# Patient Record
Sex: Male | Born: 2000
Health system: Southern US, Community
[De-identification: ages and names within clinical notes are randomized; demographics above are authoritative.]

## PROBLEM LIST (undated history)

## (undated) DIAGNOSIS — S060X9A Concussion with loss of consciousness of unspecified duration, initial encounter: Secondary | ICD-10-CM

## (undated) DIAGNOSIS — L700 Acne vulgaris: Secondary | ICD-10-CM

## (undated) DIAGNOSIS — S8290XA Unspecified fracture of unspecified lower leg, initial encounter for closed fracture: Secondary | ICD-10-CM

## (undated) DIAGNOSIS — S060XAA Concussion with loss of consciousness status unknown, initial encounter: Secondary | ICD-10-CM

## (undated) DIAGNOSIS — T7840XA Allergy, unspecified, initial encounter: Secondary | ICD-10-CM

## (undated) DIAGNOSIS — K900 Celiac disease: Secondary | ICD-10-CM

## (undated) HISTORY — DX: Acne vulgaris: L70.0

## (undated) HISTORY — PX: OTHER SURGICAL HISTORY: SHX169

## (undated) HISTORY — DX: Celiac disease: K90.0

## (undated) HISTORY — DX: Unspecified fracture of unspecified lower leg, initial encounter for closed fracture: S82.90XA

## (undated) HISTORY — DX: Allergy, unspecified, initial encounter: T78.40XA

---
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
## 2018-02-16 ENCOUNTER — Telehealth: Payer: Self-pay | Admitting: Family Medicine

## 2018-02-16 NOTE — Telephone Encounter (Signed)
Copied from [place] [id number]. Topic: Referral - Request >> Feb 16, 2018  3:43 PM Mylinda Latina, NT wrote: Reason for CRM: Patient mother called and states the patient would like to be tested for ADHD. She is wanting a referral to a specialist that does this type of testing . Please call when and if the referral is placed   CB# [phone]

## 2018-02-19 NOTE — Telephone Encounter (Signed)
Dr. Irven Shelling does that testing here at [facility]

## 2018-02-19 NOTE — Telephone Encounter (Signed)
Sent to PCP to advise will an OV with you be needed first?

## 2018-02-19 NOTE — Telephone Encounter (Signed)
Called and spoke with pt's mother mother advise to call [phone] to get set up for an appt with Irven Shelling PhD.

## 2018-02-27 ENCOUNTER — Ambulatory Visit (INDEPENDENT_AMBULATORY_CARE_PROVIDER_SITE_OTHER): Payer: BLUE CROSS/BLUE SHIELD | Admitting: Psychology

## 2018-02-27 DIAGNOSIS — F902 Attention-deficit hyperactivity disorder, combined type: Secondary | ICD-10-CM | POA: Diagnosis not present

## 2018-03-08 ENCOUNTER — Ambulatory Visit (INDEPENDENT_AMBULATORY_CARE_PROVIDER_SITE_OTHER): Payer: BLUE CROSS/BLUE SHIELD | Admitting: Psychology

## 2018-03-08 DIAGNOSIS — F902 Attention-deficit hyperactivity disorder, combined type: Secondary | ICD-10-CM | POA: Diagnosis not present

## 2018-03-08 DIAGNOSIS — F1010 Alcohol abuse, uncomplicated: Secondary | ICD-10-CM

## 2018-03-12 ENCOUNTER — Telehealth: Payer: Self-pay | Admitting: *Deleted

## 2018-03-12 ENCOUNTER — Encounter: Payer: Self-pay | Admitting: Family Medicine

## 2018-03-12 ENCOUNTER — Ambulatory Visit: Payer: BLUE CROSS/BLUE SHIELD | Admitting: Family Medicine

## 2018-03-12 VITALS — BP 112/60 | HR 78 | Temp 98.1°F | Ht 72.0 in | Wt 190.8 lb

## 2018-03-12 DIAGNOSIS — F900 Attention-deficit hyperactivity disorder, predominantly inattentive type: Secondary | ICD-10-CM

## 2018-03-12 MED ORDER — LISDEXAMFETAMINE DIMESYLATE 40 MG PO CAPS: 40.0000 mg | ORAL_CAPSULE | ORAL | 0 refills | Status: DC

## 2018-03-12 NOTE — Telephone Encounter (Signed)
>>   Mar 12, 2018  3:52 PM Sheran Luz wrote: Patients mother called back after speaking with the pharmacy. The pharmacy advised her that the pre authorization needs to be sent to their insurance company BC/BS explaining why pt needs to be on this medication. Pt is only in town for today. Pts mother requested that this medication now be sent to.    [place], [place], [place] [address] [phone]

## 2018-03-12 NOTE — Telephone Encounter (Signed)
Copied from [place] [phone]. Topic: General - Other >> Mar 12, 2018  3:23 PM Judyann Munson wrote: Reason for CRM:  Patient mother is calling to advise the medication lisdexamfetamine (VYVANSE) 40 MG capsule is needing a pre authorization for the pharmacy . The patient will be leaving for back to school this evening and is needing this medication today please advise

## 2018-03-12 NOTE — Progress Notes (Signed)
   Subjective:    Patient ID: Angel Foster, male    DOB: Dec 12, 2000, 17 y.o.   MRN: [id number]  HPI Here to discuss treatment for ADHD. He did well in high school but he has struggled in college to focus and get his work done. He is entering his sophomore year at [employer] this week. He had testing done with Dr. Irven Shelling and she diagnosed him with ADHD. Of note I treat his father for ADHD as well. Angel Foster admits to trying some medications last year from his friends to help him study. He tried Adderall and it helped, but it made him feel anxious and jittery. He then tried Vyvanse and he was very happy with how this worked for him.    Review of Systems  Constitutional: Negative.   Respiratory: Negative.   Cardiovascular: Negative.   Neurological: Negative.   Psychiatric/Behavioral: Positive for decreased concentration. Negative for dysphoric mood. The patient is not nervous/anxious.        Objective:   Physical Exam  Constitutional: He is oriented to person, place, and time. He appears well-developed and well-nourished.  Cardiovascular: Normal rate, regular rhythm, normal heart sounds and intact distal pulses.  Pulmonary/Chest: Effort normal and breath sounds normal.  Neurological: He is alert and oriented to person, place, and time.          Assessment & Plan:  ADHD, try Vyvanse 40 mg every morning. He will report back to [place] in a few weeks.  Alysia Penna, MD

## 2018-03-12 NOTE — Telephone Encounter (Signed)
Prior auth for Vyvanse 37m sent to [email or web address]-key [id number].  Approval given-CaseId:[id number];Status:Approved;Review Type:Prior Auth;Coverage Start Date:02/10/2018;Coverage End Date:03/12/2019 and [facility], informed Angel Foster this and he stated he will inform the pt.

## 2018-03-21 ENCOUNTER — Ambulatory Visit: Payer: BLUE CROSS/BLUE SHIELD | Admitting: Psychology

## 2018-04-03 ENCOUNTER — Telehealth: Payer: Self-pay | Admitting: Family Medicine

## 2018-04-03 NOTE — Telephone Encounter (Signed)
Last OV 03/12/2018   Last refilled 03/12/2018 disp 30 with no refills   Sent to PCP to advise   Pharmacy has been changed to refill vyvanse

## 2018-04-03 NOTE — Telephone Encounter (Signed)
Copied from [place] [phone]. Topic: Quick Communication - See Telephone Encounter >> Apr 03, 2018  8:42 AM Ahmed Prima L wrote: CRM for notification. See Telephone encounter for: 04/03/18.  Patient's mom called and would like lisdexamfetamine (VYVANSE) 40 MG capsule to be transferred to another pharmacy. [facility] [address] [address], [place]. The phone number is [phone].  She would like to also know can next time it be [facility] day supply [facility] so that she does not have to call each month? [facility], [facility] will fill it for 90 days for lisdexamfetamine (VYVANSE) 40 MG capsule

## 2018-04-04 MED ORDER — LISDEXAMFETAMINE DIMESYLATE 40 MG PO CAPS: 40.0000 mg | ORAL_CAPSULE | ORAL | 0 refills | Status: DC

## 2018-04-04 NOTE — Telephone Encounter (Signed)
[facility] [place] and I sent in #90 to [facility] to begin on 05-04-18

## 2018-04-04 NOTE — Telephone Encounter (Signed)
Called and spoke with pt's mother. Mother advised and voiced understanding.

## 2018-05-07 NOTE — Telephone Encounter (Signed)
Pts mom states that [facility] canceled the refill request for lisdexamfetamine (VYVANSE) 40 MG capsule  And needing a prior auth before they can fill for this pt. Also, they stated a rx cannot be written for a future date. Mom states pt took his last pill today. She would like to prior auth done for the medication, and to see if a 30 day refill can be sent ot he [facility] in [place] to last until [facility] can fill the medication? Please advise mom.  [id number] [place] [place] [phone] (Phone) [phone] (Fax)

## 2018-05-08 MED ORDER — LISDEXAMFETAMINE DIMESYLATE 40 MG PO CAPS: 40.0000 mg | ORAL_CAPSULE | ORAL | 0 refills | Status: DC

## 2018-05-08 NOTE — Telephone Encounter (Addendum)
Mom calling back today to check on the status of the 30 day lisdexamfetamine (VYVANSE) 40 MG capsule  Mom states pt is completely out of this med, and has 6 hours of class today.  Mom states you can work on the prior, but she needs the rx asap sent to  [facility] [place] [place] [phone] (Phone) [phone] (Fax)   As noted[facility] not [facility], and she is very concerned pt does not have any meds.

## 2018-05-08 NOTE — Telephone Encounter (Signed)
Done

## 2018-05-08 NOTE — Telephone Encounter (Signed)
30 day of the vyvanse sent to the pharmacy per Dr. Sarajane Jews.  Will still need the PA done for the 90 day supply.  JoAnn please advise. Thanks

## 2018-05-08 NOTE — Telephone Encounter (Signed)
Dr. Sarajane Jews please advise on the 30 day suppy of vyvanse that needs to be sen to the local pharmacy today.

## 2018-05-09 NOTE — Telephone Encounter (Signed)
Attempted to resubmit prior auth for 90 tablets.  Note in [email or web address].com stated: " An active PA is already on file with expiration date of 03/12/2019. Please wait to resubmit request within 60 days of that expiration date to obtain a PA renewal".  I called the pts mother Anderson Malta and left a detailed message with this info and to contact the insurance with any further questions.

## 2018-05-25 ENCOUNTER — Ambulatory Visit: Payer: Self-pay | Admitting: Psychology

## 2018-05-31 NOTE — Telephone Encounter (Signed)
Mom, Anderson Malta needs to know if prior Angel Foster was already approved for a 90 days supply? Patient will run out of medication on 06/08/2018 and has been wanting the 90 day through [facility]. The current script on file was written to be filled on 07/08/2018. Please call her back to clarify whether the patient needs to request another 30 day supply or if a 90 day will be called in?

## 2018-06-01 NOTE — Telephone Encounter (Addendum)
Caller name:Limas,Jennifer Relation to pt: mother  Call back number: [phone]   Reason for call:  Mother checking on the status of message below, please advise

## 2018-06-04 NOTE — Telephone Encounter (Signed)
I called the pts mother and informed her a prior Josem Kaufmann was requested and cannot be done as this was requested and approved through August 2020 (see phone note from 10/16 and 8/19) and I advised she contact the insurance company and not [employer].  She asked that a [facility] [place] at this time as he will run out on 11/15.  Message sent to Dr Barbie Banner asst.

## 2018-06-05 MED ORDER — LISDEXAMFETAMINE DIMESYLATE 40 MG PO CAPS: 40.0000 mg | ORAL_CAPSULE | ORAL | 0 refills | Status: DC

## 2018-06-05 NOTE — Addendum Note (Signed)
Addended by: Alysia Penna A on: 06/05/2018 05:07 PM   Modules accepted: Orders

## 2018-06-05 NOTE — Telephone Encounter (Signed)
I sent a 30 day supply to [facility]

## 2018-06-05 NOTE — Telephone Encounter (Signed)
Called and spoke with pts mother and she is aware of the rx that has been sent to the pharmacy in [place].

## 2018-06-05 NOTE — Telephone Encounter (Signed)
Dr. Sarajane Jews please advise of the 30 day supply that they are requesting to get sent in. Thanks

## 2018-06-25 ENCOUNTER — Telehealth: Payer: Self-pay | Admitting: *Deleted

## 2018-06-25 NOTE — Telephone Encounter (Signed)
No he does not need a booster. He had both doses in the past

## 2018-06-25 NOTE — Telephone Encounter (Signed)
Copied from [place] [id number]. Topic: General - Inquiry >> Jun 20, 2018  2:02 PM Scherrie Gerlach wrote: Reason for CRM: mom states pt will pt will be home from college this next week and wants to know if the dr thinks he should get a mmr booster? He is not at any of the colleges that had an outbreak, she just wanted to know if you thought a good idea.   Dr. Sarajane Jews please advise. Thanks

## 2018-07-03 ENCOUNTER — Other Ambulatory Visit: Payer: Self-pay | Admitting: Family Medicine

## 2018-07-03 NOTE — Telephone Encounter (Signed)
Copied from [place] [id number]. Topic: Quick Communication - Rx Refill/Question >> Jul 03, 2018 10:14 AM Reyne Dumas L wrote: Medication: lisdexamfetamine (VYVANSE) 40 MG capsule  Has the patient contacted their pharmacy? No - usually sent to pharmacy at pt's school, but he will be in [place] for break and needs this sent to local pharmacy (Agent: If no, request that the patient contact the pharmacy for the refill.) (Agent: If yes, when and what did the pharmacy advise?)  Preferred Pharmacy (with phone number or street name): [id number] [phone](Phone) [phone](Fax)  Agent: Please be advised that RX refills may take up to 3 business days. We ask that you follow-up with your pharmacy.

## 2018-07-05 NOTE — Telephone Encounter (Signed)
Called and spoke with pts mother and she is aware that there is no need for the booster mmr.

## 2018-07-09 NOTE — Telephone Encounter (Signed)
Dr. Sarajane Jews please advise on refill. Thanks

## 2018-07-09 NOTE — Telephone Encounter (Signed)
Patient calling to check the status of this refill. States that she has checked with the pharmacy and they have not received anything. States that the patient was supposed to have this medication by 07/08/18. Please advise. Would like a call regarding this matter today.  CB#: [phone]

## 2018-07-10 MED ORDER — LISDEXAMFETAMINE DIMESYLATE 40 MG PO CAPS: 40.0000 mg | ORAL_CAPSULE | ORAL | 0 refills | Status: DC

## 2018-07-10 NOTE — Telephone Encounter (Signed)
Done

## 2018-08-06 ENCOUNTER — Telehealth: Payer: Self-pay | Admitting: Family Medicine

## 2018-08-06 NOTE — Telephone Encounter (Signed)
Dr. Sarajane Jews please advise of refills.  Thanks

## 2018-08-06 NOTE — Telephone Encounter (Signed)
Copied from [place] [id number]. Topic: Quick Communication - Rx Refill/Question >> Aug 06, 2018  3:54 PM Judyann Munson wrote: Medication: lisdexamfetamine (VYVANSE) 40 MG capsule  Has the patient contacted their pharmacy? No   Preferred Pharmacy (with phone number or street name): [facility] [place], [place] [phone] (Phone) [phone] (Fax)    Agent: Please be advised that RX refills may take up to 3 business days. We ask that you follow-up with your pharmacy.

## 2018-08-07 MED ORDER — LISDEXAMFETAMINE DIMESYLATE 40 MG PO CAPS: 40.0000 mg | ORAL_CAPSULE | ORAL | 0 refills | Status: DC

## 2018-08-07 NOTE — Telephone Encounter (Signed)
Done

## 2018-08-10 ENCOUNTER — Telehealth: Payer: Self-pay | Admitting: Family Medicine

## 2018-08-10 NOTE — Telephone Encounter (Signed)
Copied from [place]. Topic: Quick Communication - Rx Refill/Question >> Aug 10, 2018  1:48 PM Percell Belt A wrote: Medication: Epipen - pt has an extreme allergy to peanut.  Mom would like to have one for him at school and one for when he comes home.    Has the patient contacted their pharmacy? No  (Agent: If no, request that the patient contact the pharmacy for the refill.) (Agent: If yes, when and what did the pharmacy advise?)  Preferred Pharmacy (with phone number or street name): [id number] [place], [phone](Phone)   Agent: Please be advised that RX refills may take up to 3 business days. We ask that you follow-up with your pharmacy.

## 2018-08-10 NOTE — Telephone Encounter (Signed)
Dr. Sarajane Jews please advise on epi pens to order. Thanks

## 2018-08-10 NOTE — Telephone Encounter (Signed)
Pt has an extreme allergy to peanut.  Mom would like to have RX for 2 Epipen's; one for him at school and one  when he is at home. Not on current med profile.     [phone][place]Phone)

## 2018-08-13 MED ORDER — EPINEPHRINE 0.3 MG/0.3ML IJ SOAJ: 0.3000 mg | INTRAMUSCULAR | 3 refills | Status: DC | PRN

## 2018-08-13 NOTE — Telephone Encounter (Signed)
Call in an EpiPen double pack, #1 with 2 rf

## 2018-08-13 NOTE — Telephone Encounter (Signed)
Called and spoke with pts mother and she is aware of rx sent to the pharmacy.

## 2018-09-12 ENCOUNTER — Telehealth: Payer: Self-pay | Admitting: Family Medicine

## 2018-09-12 NOTE — Telephone Encounter (Signed)
Copied from [place] [phone]. Topic: Quick Communication - See Telephone Encounter >> Sep 12, 2018 11:03 AM Ivar Drape wrote: CRM for notification. See Telephone encounter for: 09/12/18. Patient would like a 30 day refill on his lisdexamfetamine (VYVANSE) 40 MG capsule medication and have it sent to his preferred pharmacy while he is in school [facility] [place]. [place], [place]

## 2018-09-13 NOTE — Telephone Encounter (Signed)
He already has enough refills to last until 11-08-18

## 2018-09-14 ENCOUNTER — Telehealth: Payer: Self-pay | Admitting: Family Medicine

## 2018-09-14 MED ORDER — LISDEXAMFETAMINE DIMESYLATE 30 MG PO CAPS: 30.0000 mg | ORAL_CAPSULE | Freq: Every day | ORAL | 0 refills | Status: DC

## 2018-09-14 NOTE — Telephone Encounter (Signed)
Dr. Sarajane Jews please advise on the change of dose from 40 mg to 30 mg.  Pt feels that the 40 mg is too strong. thanks

## 2018-09-14 NOTE — Telephone Encounter (Signed)
I changed this to 30 mg and sent in a 3 month supply

## 2018-09-14 NOTE — Telephone Encounter (Signed)
Await his message note

## 2018-09-14 NOTE — Addendum Note (Signed)
Addended by: Alysia Penna A on: 09/14/2018 12:48 PM   Modules accepted: Orders

## 2018-09-14 NOTE — Telephone Encounter (Signed)
Patient is wanting to know if script can be brought down to 64m due to 457mbeing to much, also he is missing script for February.

## 2018-10-10 ENCOUNTER — Telehealth: Payer: Self-pay | Admitting: Family Medicine

## 2018-10-10 NOTE — Telephone Encounter (Signed)
Copied from [place] [id number]. Topic: Quick Communication - Rx Refill/Question >> Oct 10, 2018  4:36 PM Mcneil, Ja-Kwan wrote: Medication: lisdexamfetamine (VYVANSE) 30 MG capsule  Has the patient contacted their pharmacy? no  Preferred Pharmacy (with phone number or street name): [facility] [place], [place] [phone] (Phone)   [phone] (Fax)  Agent: Please be advised that RX refills may take up to 3 business days. We ask that you follow-up with your pharmacy.

## 2018-10-11 NOTE — Telephone Encounter (Signed)
Dr. Sarajane Jews please advise on refill of vyvanse. Thanks

## 2018-10-11 NOTE — Telephone Encounter (Signed)
He already has refills available until 12-13-18

## 2018-10-15 NOTE — Telephone Encounter (Signed)
I have called Anderson Malta, the pts mother and left a Vm for her to make her aware to call the pharmacy for the refills.

## 2018-10-26 DIAGNOSIS — L308 Other specified dermatitis: Secondary | ICD-10-CM | POA: Diagnosis not present

## 2018-12-10 ENCOUNTER — Encounter: Payer: Self-pay | Admitting: General Surgery

## 2018-12-11 ENCOUNTER — Ambulatory Visit (INDEPENDENT_AMBULATORY_CARE_PROVIDER_SITE_OTHER): Payer: BC Managed Care – PPO | Admitting: Internal Medicine

## 2018-12-11 ENCOUNTER — Other Ambulatory Visit: Payer: Self-pay

## 2018-12-11 ENCOUNTER — Encounter: Payer: Self-pay | Admitting: Internal Medicine

## 2018-12-11 DIAGNOSIS — K900 Celiac disease: Secondary | ICD-10-CM

## 2018-12-11 NOTE — Patient Instructions (Signed)
1.  Routine office follow-up 1 year.  Contact the office in the interim for any questions or clinical problems.  No need for serologies at this time.

## 2018-12-11 NOTE — Progress Notes (Signed)
HISTORY OF PRESENT ILLNESS:  Angel Foster is a 18 y.o. male, rising junior at [employer], who established Dec 05, 2017 with this office regarding a history of celiac disease.  Previously under the care of Dr. Rodman Pickle.  Diagnosed with celiac sprue January 2012 after being evaluated for significant abdominal complaints.  Since that time he has been compliant with his diet.  Clinically he has done extremely well.  He was asymptomatic last year.  We did check serologies which were negative or normal.  He tells me that he continues to be compliant with his diet.  His GI review of systems is entirely negative.  He asks if he needs to have blood work at this time.  No other questions or issues  REVIEW OF SYSTEMS:  All non-GI ROS negative unless otherwise stated in the HPI  Past Medical History:  Diagnosis Date  . Allergy   . Celiac disease 2011   biopsy; Dr. Rodman Pickle  . Concussion   . Cystic acne vulgaris    sees Merdis Delay at the [place]   . Fracture of lower leg, closed    right tibia/fibula, casted per Dr. Micheline Chapman     Past Surgical History:  Procedure Laterality Date  . ESOPHAGOGASTRODUODENOSCOPY ENDOSCOPY  2011   per Dr. Rodman Pickle  . wisdon teeth extraction      Social History Angel Foster  reports that he has never smoked. He has never used smokeless tobacco. He reports that he does not drink alcohol or use drugs.  family history includes ADD / ADHD in his father; Diabetes in his mother; Hyperthyroidism in his mother.  Allergies  Allergen Reactions  . Peanut-Containing Drug Products Anaphylaxis  . Claritin [Loratadine] Other (See Comments)    Hallucinations. TOLERATES fexofenadine and cetirizine.  . Gluten Meal   . Other     Gluten- celiac diesease        PHYSICAL EXAMINATION: Patient looks well.  Alert and oriented.  Cooperative No additional physical exam information with telehealth visit  ASSESSMENT:  1.  Celiac sprue.   Asymptomatic with dietary compliance   PLAN:  1.  Routine office follow-up 1 year.  Contact the office in the interim for any questions or clinical problems.  No need for serologies at this time.  This telehealth visit was initiated by the patient and consented for by the patient who was in his home while I was in my office throughout the encounter.  He understands her may be associated professional charge for this service

## 2018-12-22 DIAGNOSIS — Z20828 Contact with and (suspected) exposure to other viral communicable diseases: Secondary | ICD-10-CM | POA: Diagnosis not present

## 2019-03-11 ENCOUNTER — Telehealth: Payer: Self-pay

## 2019-03-11 NOTE — Telephone Encounter (Signed)
Copied from [place] [id number]. Topic: General - Other >> Mar 11, 2019  2:59 PM Ivar Drape wrote: Reason for CRM:  Patient would like a call back to discuss patient's possibly switching ADHD medication

## 2019-03-12 NOTE — Telephone Encounter (Signed)
Doxy scheduled to discuss this. Nothing further needed.

## 2019-03-13 ENCOUNTER — Encounter: Payer: Self-pay | Admitting: Family Medicine

## 2019-03-13 ENCOUNTER — Telehealth (INDEPENDENT_AMBULATORY_CARE_PROVIDER_SITE_OTHER): Payer: BC Managed Care – PPO | Admitting: Family Medicine

## 2019-03-13 ENCOUNTER — Other Ambulatory Visit: Payer: Self-pay

## 2019-03-13 DIAGNOSIS — F900 Attention-deficit hyperactivity disorder, predominantly inattentive type: Secondary | ICD-10-CM | POA: Diagnosis not present

## 2019-03-13 MED ORDER — AMPHETAMINE-DEXTROAMPHETAMINE 10 MG PO TABS: 10.0000 mg | ORAL_TABLET | Freq: Two times a day (BID) | ORAL | 0 refills | Status: DC

## 2019-03-13 NOTE — Progress Notes (Signed)
Virtual Visit via Video Note  I connected with the patient on 03/13/19 at  1:00 PM EDT by a video enabled telemedicine application and verified that I am speaking with the correct person using two identifiers.  Location patient: home Location provider:work or home office Persons participating in the virtual visit: patient, provider  I discussed the limitations of evaluation and management by telemedicine and the availability of in person appointments. The patient expressed understanding and agreed to proceed.   HPI: Here to discuss his ADHD medication. He has always taken Vyvanse, and this has been effective for him. However the effects last too long in his body, even at lower doses. This makes it hard for him to sleep at night. He asks if he can try an immediate release medication instead. He goes back to college on Sept 7.   ROS: See pertinent positives and negatives per HPI.  Past Medical History:  Diagnosis Date  . Allergy   . Celiac disease 2011   biopsy; Dr. Rodman Pickle  . Concussion   . Cystic acne vulgaris    sees Merdis Delay at the [place]   . Fracture of lower leg, closed    right tibia/fibula, casted per Dr. Micheline Chapman     Past Surgical History:  Procedure Laterality Date  . ESOPHAGOGASTRODUODENOSCOPY ENDOSCOPY  2011   per Dr. Rodman Pickle  . wisdon teeth extraction      Family History  Problem Relation Age of Onset  . Diabetes Mother   . Hyperthyroidism Mother   . ADD / ADHD Father      Current Outpatient Medications:  .  amphetamine-dextroamphetamine (ADDERALL) 10 MG tablet, Take 1 tablet (10 mg total) by mouth 2 (two) times daily., Disp: 60 tablet, Rfl: 0 .  EPINEPHrine (EPIPEN 2-PAK) 0.3 mg/0.3 mL IJ SOAJ injection, Inject 0.3 mLs (0.3 mg total) into the muscle as needed for anaphylaxis., Disp: 1 Device, Rfl: 3 .  fluticasone (FLONASE) 50 MCG/ACT nasal spray, Place into both nostrils daily., Disp: , Rfl:   EXAM:  VITALS per patient if  applicable:  GENERAL: alert, oriented, appears well and in no acute distress  HEENT: atraumatic, conjunttiva clear, no obvious abnormalities on inspection of external nose and ears  NECK: normal movements of the head and neck  LUNGS: on inspection no signs of respiratory distress, breathing rate appears normal, no obvious gross SOB, gasping or wheezing  CV: no obvious cyanosis  MS: moves all visible extremities without noticeable abnormality  PSYCH/NEURO: pleasant and cooperative, no obvious depression or anxiety, speech and thought processing grossly intact  ASSESSMENT AND PLAN: ADHD. Stop Vyvanse and try immediate release Adderall 10 mg to take it once or twice daily.  Alysia Penna, MD  Discussed the following assessment and plan:  No diagnosis found.     I discussed the assessment and treatment plan with the patient. The patient was provided an opportunity to ask questions and all were answered. The patient agreed with the plan and demonstrated an understanding of the instructions.   The patient was advised to call back or seek an in-person evaluation if the symptoms worsen or if the condition fails to improve as anticipated.

## 2019-04-11 ENCOUNTER — Other Ambulatory Visit: Payer: Self-pay | Admitting: Family Medicine

## 2019-04-11 NOTE — Telephone Encounter (Signed)
Ok for refill? 

## 2019-04-11 NOTE — Telephone Encounter (Signed)
Okay for refill? Please advise

## 2019-04-11 NOTE — Telephone Encounter (Signed)
Copied from [place] [phone]. Topic: Quick Communication - Rx Refill/Question >> Apr 11, 2019 12:59 PM Leward Quan A wrote: Medication: amphetamine-dextroamphetamine (ADDERALL) 10 MG tablet   Has the patient contacted their pharmacy? Yes.   (Agent: If no, request that the patient contact the pharmacy for the refill.) (Agent: If yes, when and what did the pharmacy advise?)  Preferred Pharmacy (with phone number or street name): [facility] [place], [place] [phone] (Phone) [phone] (Fax)    Agent: Please be advised that RX refills may take up to 3 business days. We ask that you follow-up with your pharmacy.

## 2019-04-11 NOTE — Telephone Encounter (Signed)
Requested medication (s) are due for refill today: yes  Requested medication (s) are on the active medication list: yes  Last refill:  03/13/2019  Future visit scheduled: no  Notes to clinic:  Refill cannot be delegated    Requested Prescriptions  Pending Prescriptions Disp Refills   amphetamine-dextroamphetamine (ADDERALL) 10 MG tablet 60 tablet 0    Sig: Take 1 tablet (10 mg total) by mouth 2 (two) times daily.     Not Delegated - Psychiatry:  Stimulants/ADHD Failed - 04/11/2019  1:05 PM      Failed - This refill cannot be delegated      Failed - Urine Drug Screen completed in last 360 days.      Passed - Valid encounter within last 3 months    Recent Outpatient Visits          4 weeks ago ADHD (attention deficit hyperactivity disorder), inattentive type   [facility]rap[facility], [facility] at [place], [place]   1 year ago Attention deficit hyperactivity disorder (ADHD), predominantly inattentive type   Therapist, [facility] at [facility], Ishmael Holter, MD   2 years ago Preventative health care   [facility] [facility] at [place], Ishmael Holter, MD   3 years ago Preventative health care   [place] [facility] [place], Ishmael Holter, MD   3 years ago Viral URI   [facility] [place], Ishmael Holter, MD

## 2019-04-12 MED ORDER — AMPHETAMINE-DEXTROAMPHETAMINE 10 MG PO TABS: 10.0000 mg | ORAL_TABLET | Freq: Two times a day (BID) | ORAL | 0 refills | Status: DC

## 2019-04-12 NOTE — Telephone Encounter (Signed)
Done

## 2019-04-18 DIAGNOSIS — L723 Sebaceous cyst: Secondary | ICD-10-CM | POA: Diagnosis not present

## 2019-04-18 DIAGNOSIS — D225 Melanocytic nevi of trunk: Secondary | ICD-10-CM | POA: Diagnosis not present

## 2019-04-18 DIAGNOSIS — L906 Striae atrophicae: Secondary | ICD-10-CM | POA: Diagnosis not present

## 2019-05-13 ENCOUNTER — Telehealth: Payer: Self-pay | Admitting: Family Medicine

## 2019-05-13 NOTE — Telephone Encounter (Signed)
Requested medication (s) are due for refill today: yes  Requested medication (s) are on the active medication list: yes  Future visit scheduled: no  Notes to clinic: refill cannot be delegated Looks like script has been written for future   Requested Prescriptions  Pending Prescriptions Disp Refills   amphetamine-dextroamphetamine (ADDERALL) 10 MG tablet 60 tablet 0    Sig: Take 1 tablet (10 mg total) by mouth 2 (two) times daily.     Not Delegated - Psychiatry:  Stimulants/ADHD Failed - 05/13/2019 10:50 AM      Failed - This refill cannot be delegated      Failed - Urine Drug Screen completed in last 360 days.      Passed - Valid encounter within last 3 months    Recent Outpatient Visits          2 months ago ADHD (attention deficit hyperactivity disorder), inattentive type   Therapist, music at [place], MD   1 year ago Attention deficit hyperactivity disorder (ADHD), predominantly inattentive type   Therapist, music at [facility], Ishmael Holter, MD   2 years ago Preventative health care   [employer] [facility] at [place], Ishmael Holter, MD   3 years ago Preventative health care   [place] [facility] [place], Ishmael Holter, MD   3 years ago Viral URI   [facility] [facility] [place], Ishmael Holter, MD

## 2019-05-13 NOTE — Telephone Encounter (Signed)
Please see below request

## 2019-05-13 NOTE — Telephone Encounter (Signed)
Medication:  amphetamine-dextroamphetamine (ADDERALL) 10 MG tablet [[id number]]   Has the patient contacted their pharmacy? Yes  (Agent: If no, request that the patient contact the pharmacy for the refill.) (Agent: If yes, when and what did the pharmacy advise?)  Preferred Pharmacy (with phone number or street name): [facility] [place], [place] [phone] (Phone) [phone] (Fax)   Agent: Please be advised that RX refills may take up to 3 business days. We ask that you follow-up with your pharmacy.

## 2019-05-14 NOTE — Telephone Encounter (Signed)
Ok to refill x 30 days only give PCP is out for family emergency. All future refill should come from PCP or should have a visit. Please phone in as my impravata does not seem to be working. Appears he had visit with PCP not long ago for this and looks like end date on one rx was incorrect.

## 2019-05-14 NOTE — Telephone Encounter (Signed)
Patient seen by PCP for this in the last few months. Ok to refill once for 30 days for PCP since PCP out on family emergency. All future refills must come from PCP. Thanks.

## 2019-05-14 NOTE — Telephone Encounter (Signed)
Please advise 

## 2019-05-14 NOTE — Telephone Encounter (Signed)
Looks like seen by PCP for this not long ago and appears one of the end days on one rx was incorrect. Ok to refill x30 days only since PCP out of office on family emergency. All future scripts should come from PCP. Thanks.

## 2019-05-15 MED ORDER — AMPHETAMINE-DEXTROAMPHETAMINE 10 MG PO TABS: 10.0000 mg | ORAL_TABLET | Freq: Two times a day (BID) | ORAL | 0 refills | Status: DC

## 2019-05-15 NOTE — Telephone Encounter (Signed)
Rx can't be called in. Dr. Inocente Salles can you please e-scribe for  30 day supply.

## 2019-07-22 ENCOUNTER — Telehealth: Payer: Self-pay | Admitting: Family Medicine

## 2019-07-22 NOTE — Telephone Encounter (Signed)
Medication Refill - Medication: Generic Adderalll 10 mg bid # 60  Has the patient contacted their pharmacy? No. (Agent: If no, request that the patient contact the pharmacy for the refill.) (Agent: If yes, when and what did the pharmacy advise?)  Preferred Pharmacy (with phone number or street name): [facility] [place] # [phone]  CB# [phone]  Agent: Please be advised that RX refills may take up to 3 business days. We ask that you follow-up with your pharmacy.

## 2019-07-22 NOTE — Telephone Encounter (Signed)
Forwarding to PCP for approval

## 2019-07-23 MED ORDER — AMPHETAMINE-DEXTROAMPHETAMINE 10 MG PO TABS: 10.0000 mg | ORAL_TABLET | Freq: Two times a day (BID) | ORAL | 0 refills | Status: DC

## 2019-07-23 NOTE — Addendum Note (Signed)
Addended by: Alysia Penna A on: 07/23/2019 10:31 AM   Modules accepted: Orders

## 2019-07-23 NOTE — Telephone Encounter (Signed)
Done

## 2019-08-21 ENCOUNTER — Telehealth: Payer: Self-pay | Admitting: Family Medicine

## 2019-08-21 MED ORDER — AMPHETAMINE-DEXTROAMPHETAMINE 10 MG PO TABS: 10.0000 mg | ORAL_TABLET | Freq: Two times a day (BID) | ORAL | 0 refills | Status: DC

## 2019-08-21 NOTE — Telephone Encounter (Signed)
Pt's mother, Yvone Neu, called in to get his medication for Adderall (amphetamine-dextroamphetamine) refilled at his Pharmacy ([place], [place], [place] )  Last refill: Dec 29th 2020 Pt is close to being out (estimated this Friday 08/23/19)

## 2019-08-21 NOTE — Telephone Encounter (Signed)
Last OV 03/13/2019

## 2019-08-21 NOTE — Telephone Encounter (Signed)
Patient is aware 

## 2019-08-21 NOTE — Telephone Encounter (Signed)
Done

## 2020-01-20 ENCOUNTER — Telehealth: Payer: Self-pay | Admitting: Family Medicine

## 2020-01-20 NOTE — Telephone Encounter (Signed)
amphetamine-dextroamphetamine (ADDERALL) 10 MG tablet(Expired)  [facility] [phone] [place], [place] Phone:  [phone]  Fax:  [phone]     The patient is almost out of this medication

## 2020-01-20 NOTE — Telephone Encounter (Signed)
Last filled 11/20/2019 Last OV 03/13/2019  Ok to fill?

## 2020-01-21 MED ORDER — AMPHETAMINE-DEXTROAMPHETAMINE 10 MG PO TABS: 10.0000 mg | ORAL_TABLET | Freq: Two times a day (BID) | ORAL | 0 refills | Status: DC

## 2020-01-21 NOTE — Addendum Note (Signed)
Addended by: Alysia Penna A on: 01/21/2020 09:44 PM   Modules accepted: Orders

## 2020-01-21 NOTE — Telephone Encounter (Signed)
Done

## 2020-02-20 DIAGNOSIS — J029 Acute pharyngitis, unspecified: Secondary | ICD-10-CM | POA: Diagnosis not present

## 2020-02-20 DIAGNOSIS — M25531 Pain in right wrist: Secondary | ICD-10-CM | POA: Diagnosis not present

## 2020-03-11 DIAGNOSIS — Z20828 Contact with and (suspected) exposure to other viral communicable diseases: Secondary | ICD-10-CM | POA: Diagnosis not present

## 2020-03-17 DIAGNOSIS — L858 Other specified epidermal thickening: Secondary | ICD-10-CM | POA: Diagnosis not present

## 2020-03-17 DIAGNOSIS — L906 Striae atrophicae: Secondary | ICD-10-CM | POA: Diagnosis not present

## 2020-03-23 DIAGNOSIS — Z20828 Contact with and (suspected) exposure to other viral communicable diseases: Secondary | ICD-10-CM | POA: Diagnosis not present

## 2020-04-21 ENCOUNTER — Other Ambulatory Visit: Payer: Self-pay | Admitting: Family Medicine

## 2020-04-21 DIAGNOSIS — F900 Attention-deficit hyperactivity disorder, predominantly inattentive type: Secondary | ICD-10-CM

## 2020-04-21 NOTE — Telephone Encounter (Addendum)
No labs Last visit was a telemedicine visit 03/13/2019.    Told that we needed an appointment to refill his medication, as it had been a year.    She stated he was in college and he "had" to have his medication.  I told her that we could do a virtual visit and scheduled one for 04/27/20 at 8:30.   I verified that the pharmacy on file was correct.

## 2020-04-21 NOTE — Telephone Encounter (Signed)
amphetamine-dextroamphetamine (ADDERALL) 10 MG tablet   [id number] [address] [place], [place] Phone:  [phone]  Fax:  [phone]

## 2020-04-23 ENCOUNTER — Telehealth: Payer: Self-pay | Admitting: Family Medicine

## 2020-04-23 NOTE — Telephone Encounter (Signed)
FYI:  Pts mother is calling in demanding information about the pt she is not on the pts DPR and is aware that we can not give her any information.  She asked if the pt can call the office and get information and she was told yes she stated that she will let him know so that he can give the office a call.

## 2020-04-23 NOTE — Telephone Encounter (Signed)
Patient has a video visit scheduled for 04/27/20.

## 2020-04-24 MED ORDER — AMPHETAMINE-DEXTROAMPHETAMINE 10 MG PO TABS: 10.0000 mg | ORAL_TABLET | Freq: Two times a day (BID) | ORAL | 0 refills | Status: DC

## 2020-04-24 NOTE — Telephone Encounter (Signed)
Pt is calling in to see if he can get his ADDERALL 10 MG refilled before Monday since he is going to be out of the medication.  [facility] [place] [place]  Pt has a virtual on Monday

## 2020-04-24 NOTE — Telephone Encounter (Signed)
Patient called and advised Rx was sent in.

## 2020-04-24 NOTE — Telephone Encounter (Signed)
I sent in a one month supply

## 2020-04-27 ENCOUNTER — Telehealth: Payer: BC Managed Care – PPO | Admitting: Family Medicine

## 2020-05-07 ENCOUNTER — Encounter: Payer: Self-pay | Admitting: Family Medicine

## 2020-05-07 ENCOUNTER — Telehealth (INDEPENDENT_AMBULATORY_CARE_PROVIDER_SITE_OTHER): Payer: BC Managed Care – PPO | Admitting: Family Medicine

## 2020-05-07 DIAGNOSIS — F900 Attention-deficit hyperactivity disorder, predominantly inattentive type: Secondary | ICD-10-CM

## 2020-05-07 MED ORDER — AMPHETAMINE-DEXTROAMPHETAMINE 15 MG PO TABS: 15.0000 mg | ORAL_TABLET | Freq: Two times a day (BID) | ORAL | 0 refills | Status: DC

## 2020-05-07 NOTE — Progress Notes (Signed)
   Subjective:    Patient ID: Angel Foster, male    DOB: 10-02-2000, 19 y.o.   MRN: [id number]  HPI Virtual Visit via Video Note  I connected with the patient on 05/07/20 at  8:30 AM EDT by a video enabled telemedicine application and verified that I am speaking with the correct person using two identifiers.  Location patient: home Location provider:work or home office Persons participating in the virtual visit: patient, provider  I discussed the limitations of evaluation and management by telemedicine and the availability of in person appointments. The patient expressed understanding and agreed to proceed.   HPI: Here to follow up on ADHD. His classes are going well. The Adderall he is taking does not seem to be as effective as it once was. He thinks his body has gotten used to it. No side effects to report.    ROS: See pertinent positives and negatives per HPI.  Past Medical History:  Diagnosis Date  . Allergy   . Celiac disease 2011   biopsy; Dr. Rodman Pickle  . Concussion   . Cystic acne vulgaris    sees Merdis Delay at the [facility]   . Fracture of lower leg, closed    right tibia/fibula, casted per Dr. Micheline Chapman     Past Surgical History:  Procedure Laterality Date  . ESOPHAGOGASTRODUODENOSCOPY ENDOSCOPY  2011   per Dr. Rodman Pickle  . wisdon teeth extraction      Family History  Problem Relation Age of Onset  . Diabetes Mother   . Hyperthyroidism Mother   . ADD / ADHD Father      Current Outpatient Medications:  .  [START ON 07/07/2020] amphetamine-dextroamphetamine (ADDERALL) 15 MG tablet, Take 1 tablet by mouth 2 (two) times daily., Disp: 60 tablet, Rfl: 0 .  EPINEPHrine (EPIPEN 2-PAK) 0.3 mg/0.3 mL IJ SOAJ injection, Inject 0.3 mLs (0.3 mg total) into the muscle as needed for anaphylaxis., Disp: 1 Device, Rfl: 3 .  fluticasone (FLONASE) 50 MCG/ACT nasal spray, Place into both nostrils daily., Disp: , Rfl:   EXAM:  VITALS per patient if  applicable:  GENERAL: alert, oriented, appears well and in no acute distress  HEENT: atraumatic, conjunttiva clear, no obvious abnormalities on inspection of external nose and ears  NECK: normal movements of the head and neck  LUNGS: on inspection no signs of respiratory distress, breathing rate appears normal, no obvious gross SOB, gasping or wheezing  CV: no obvious cyanosis  MS: moves all visible extremities without noticeable abnormality  PSYCH/NEURO: pleasant and cooperative, no obvious depression or anxiety, speech and thought processing grossly intact  ASSESSMENT AND PLAN: ADHD, we will increase the Adderall to 15 mg BID.  Alysia Penna, MD  Discussed the following assessment and plan:  No diagnosis found.     I discussed the assessment and treatment plan with the patient. The patient was provided an opportunity to ask questions and all were answered. The patient agreed with the plan and demonstrated an understanding of the instructions.   The patient was advised to call back or seek an in-person evaluation if the symptoms worsen or if the condition fails to improve as anticipated.     Review of Systems     Objective:   Physical Exam        Assessment & Plan:

## 2020-07-19 ENCOUNTER — Other Ambulatory Visit: Payer: Self-pay | Admitting: Family Medicine

## 2020-08-26 DIAGNOSIS — M778 Other enthesopathies, not elsewhere classified: Secondary | ICD-10-CM | POA: Diagnosis not present

## 2020-08-26 DIAGNOSIS — M25522 Pain in left elbow: Secondary | ICD-10-CM | POA: Diagnosis not present

## 2020-09-03 ENCOUNTER — Telehealth: Payer: Self-pay | Admitting: Family Medicine

## 2020-09-03 NOTE — Telephone Encounter (Signed)
  amphetamine-dextroamphetamine (ADDERALL) 15 MG tablet(Expired  [id number] [address] [place], [place] Phone:  [phone]  Fax:  [phone]

## 2020-09-04 NOTE — Telephone Encounter (Signed)
Last office visit---05/07/2020 Last refill--07/07/2020---60 tabs no refills  No future appointment scheduled

## 2020-09-07 MED ORDER — AMPHETAMINE-DEXTROAMPHETAMINE 15 MG PO TABS: 15.0000 mg | ORAL_TABLET | Freq: Two times a day (BID) | ORAL | 0 refills | Status: DC

## 2020-09-07 NOTE — Telephone Encounter (Signed)
Done

## 2020-09-07 NOTE — Telephone Encounter (Signed)
Pt called back and was given the below msg.

## 2020-09-07 NOTE — Telephone Encounter (Signed)
Unable to leave VM that RX was sent due to mailbox is full. Dm/cma

## 2020-09-07 NOTE — Addendum Note (Signed)
Addended by: Alysia Penna A on: 09/07/2020 10:05 AM   Modules accepted: Orders

## 2020-12-17 ENCOUNTER — Telehealth: Payer: Self-pay | Admitting: Family Medicine

## 2020-12-17 NOTE — Telephone Encounter (Signed)
Last video visit- 05/07/2020 Last refill-11/05/2020--60 tabs no refills  No future office visit scheduled.

## 2020-12-17 NOTE — Telephone Encounter (Signed)
Pt is calling in needing a refill for Rx amphetamine-dextroamphetamine (ADDERALL) 15 MG Pharm:  [facility] [place] [place]

## 2020-12-18 ENCOUNTER — Telehealth: Payer: Self-pay | Admitting: Family Medicine

## 2020-12-18 NOTE — Telephone Encounter (Signed)
Pt call and stated he need a refill on amphetamine-dextroamphetamine (ADDERALL) 15 MG tablet sent to  [facility] [place] [place] Phone:  [phone]  Fax:  [phone]

## 2020-12-18 NOTE — Telephone Encounter (Signed)
Pt LOV was 05/07/2020 Last refill was done on 11/05/2020 Please advise

## 2020-12-22 MED ORDER — AMPHETAMINE-DEXTROAMPHETAMINE 15 MG PO TABS: 15.0000 mg | ORAL_TABLET | Freq: Two times a day (BID) | ORAL | 0 refills | Status: DC

## 2020-12-22 NOTE — Telephone Encounter (Signed)
Done

## 2020-12-22 NOTE — Addendum Note (Signed)
Addended by: Alysia Penna A on: 12/22/2020 08:03 AM   Modules accepted: Orders

## 2020-12-22 NOTE — Addendum Note (Signed)
Addended by: Alysia Penna A on: 12/22/2020 08:04 AM   Modules accepted: Orders

## 2021-02-24 DIAGNOSIS — L2089 Other atopic dermatitis: Secondary | ICD-10-CM | POA: Diagnosis not present

## 2021-02-24 DIAGNOSIS — L858 Other specified epidermal thickening: Secondary | ICD-10-CM | POA: Diagnosis not present

## 2021-06-07 ENCOUNTER — Other Ambulatory Visit: Payer: Self-pay

## 2021-06-07 NOTE — Telephone Encounter (Signed)
Patient called requesting Rx refill amphetamine-dextroamphetamine (ADDERALL) 15 MG tablet  Send to [facility]

## 2021-06-07 NOTE — Addendum Note (Signed)
Addended by: Rodrigo Ran on: 06/07/2021 03:47 PM   Modules accepted: Orders

## 2021-06-08 MED ORDER — AMPHETAMINE-DEXTROAMPHETAMINE 15 MG PO TABS: 15.0000 mg | ORAL_TABLET | Freq: Two times a day (BID) | ORAL | 0 refills | Status: DC

## 2021-06-08 NOTE — Telephone Encounter (Signed)
Done

## 2021-06-08 NOTE — Addendum Note (Signed)
Addended by: Alysia Penna A on: 06/08/2021 07:43 AM   Modules accepted: Orders

## 2021-08-04 ENCOUNTER — Telehealth: Payer: Self-pay | Admitting: Family Medicine

## 2021-08-04 NOTE — Telephone Encounter (Signed)
He already has a refill on file that he can fill on 08-08-21. After that he will need an OV for any more

## 2021-08-04 NOTE — Telephone Encounter (Signed)
Last VV- 05/07/2020  No future OV scheduled.   Can this patient receive a refill?    Can f/u visit be video visit?   Will call patient to schedule.

## 2021-08-04 NOTE — Telephone Encounter (Signed)
Patient called in for a refill on his Amphetamine-dextroamphetamine (Adderall) 15 mg Because pharmacy stated no more refills

## 2021-08-05 NOTE — Telephone Encounter (Signed)
Lvm for patient with information, also that appointment is required for any future refills

## 2021-09-27 ENCOUNTER — Telehealth: Payer: Self-pay | Admitting: Family Medicine

## 2021-09-27 NOTE — Telephone Encounter (Signed)
Pt call and stated he need a refill on his amphetamine-dextroamphetamine (ADDERALL) 15 MG tablet  sent to  ?[facility] [place] [place] Phone:  [phone]  ?Fax:  [phone]  ?  ? ?

## 2021-09-27 NOTE — Telephone Encounter (Signed)
Last refill- 08/08/21--60 tabs ?Last VV- 05/07/2020 ? ?No future OV scheduled.   Can this patient receive a refill? ? ? ?

## 2021-09-28 MED ORDER — AMPHETAMINE-DEXTROAMPHETAMINE 15 MG PO TABS: 15.0000 mg | ORAL_TABLET | Freq: Two times a day (BID) | ORAL | 0 refills | Status: DC

## 2021-09-28 NOTE — Telephone Encounter (Signed)
Done

## 2021-09-28 NOTE — Telephone Encounter (Signed)
Left detailed message on pt mobile number ?

## 2022-01-20 ENCOUNTER — Telehealth: Payer: Self-pay | Admitting: Family Medicine

## 2022-01-20 NOTE — Telephone Encounter (Signed)
Spoke with the patient and informed him the message was sent to Dr Sarajane Jews to review when he returns to the office on 7/3.

## 2022-01-20 NOTE — Telephone Encounter (Signed)
Patient called because he needs new prescription for amphetamine-dextroamphetamine (ADDERALL) 15 MG tablet (Expired)     [facility] [place] [place], [place] [address]    Please advise

## 2022-01-20 NOTE — Telephone Encounter (Signed)
Last Rx given on 11/28/2021 for #60 with no ref

## 2022-01-20 NOTE — Telephone Encounter (Signed)
Last visit with pcp was in 2021 . Will defer to Dr Sarajane Jews pre scriber.   For refill

## 2022-01-24 MED ORDER — AMPHETAMINE-DEXTROAMPHETAMINE 15 MG PO TABS: 15.0000 mg | ORAL_TABLET | Freq: Two times a day (BID) | ORAL | 0 refills | Status: DC

## 2022-01-24 NOTE — Telephone Encounter (Signed)
Done

## 2022-01-24 NOTE — Addendum Note (Signed)
Addended by: Alysia Penna A on: 01/24/2022 05:44 PM   Modules accepted: Orders

## 2022-01-26 ENCOUNTER — Telehealth: Payer: Self-pay | Admitting: Family Medicine

## 2022-01-26 NOTE — Telephone Encounter (Signed)
Pt is calling and amphetamine-dextroamphetamine (ADDERALL) 15 MG tablet need to [facility] to [facility] [address] [id number] [place] [address] [place]:  [phone] Fax:  [phone]

## 2022-01-28 MED ORDER — AMPHETAMINE-DEXTROAMPHETAMINE 15 MG PO TABS: 15.0000 mg | ORAL_TABLET | Freq: Two times a day (BID) | ORAL | 0 refills | Status: DC

## 2022-01-28 NOTE — Telephone Encounter (Signed)
Called patient to inform medication has been [facility] [place].

## 2022-01-28 NOTE — Telephone Encounter (Signed)
[place]m [place] [facility] [place]   Pharmacy updated.

## 2022-01-28 NOTE — Telephone Encounter (Signed)
Done

## 2022-05-23 ENCOUNTER — Telehealth: Payer: Self-pay | Admitting: Family Medicine

## 2022-05-23 MED ORDER — AMPHETAMINE-DEXTROAMPHETAMINE 15 MG PO TABS
15.0000 mg | ORAL_TABLET | Freq: Two times a day (BID) | ORAL | 0 refills | Status: AC
Start: 2022-05-23 — End: 2022-06-22

## 2022-05-23 NOTE — Telephone Encounter (Signed)
Refill amphetamine-dextroamphetamine (ADDERALL) 15 MG tablet (Expired)    [id number] [place] [address] [place]:  [phone] Fax:  [phone]

## 2022-05-23 NOTE — Telephone Encounter (Signed)
I sent in a 30 day supply, but we cannot give him any after that unless he makes an [place] office visit. We have not physically examined him since 2019

## 2022-05-23 NOTE — Addendum Note (Signed)
Addended by: Alysia Penna A on: 05/23/2022 05:12 PM   Modules accepted: Orders

## 2022-05-23 NOTE — Telephone Encounter (Addendum)
Last refill- 03-31-22-60 tabs, 0 refill Last VV-05/07/20**   No future OV scheduled.  Can this patient receive a refill?

## 2022-05-24 NOTE — Telephone Encounter (Signed)
Lvm for patient with message from Dr. Sarajane Jews.
